# Patient Record
Sex: Male | Born: 1980 | Race: White | Hispanic: No
Health system: Southern US, Community
[De-identification: ages and names within clinical notes are randomized; demographics above are authoritative.]

---

## 2007-11-06 ENCOUNTER — Emergency Department (HOSPITAL_COMMUNITY): Admission: EM | Admit: 2007-11-06 | Discharge: 2007-11-06 | Payer: Self-pay | Admitting: Emergency Medicine

## 2015-08-09 ENCOUNTER — Emergency Department (HOSPITAL_COMMUNITY): Payer: No Typology Code available for payment source

## 2015-08-09 ENCOUNTER — Encounter (HOSPITAL_COMMUNITY): Payer: Self-pay | Admitting: Emergency Medicine

## 2015-08-09 ENCOUNTER — Emergency Department (HOSPITAL_COMMUNITY)
Admission: EM | Admit: 2015-08-09 | Discharge: 2015-08-09 | Disposition: A | Discharge status: Home / Self Care | Payer: No Typology Code available for payment source | Attending: Emergency Medicine | Admitting: Emergency Medicine

## 2015-08-09 DIAGNOSIS — Y998 Other external cause status: Secondary | ICD-10-CM | POA: Diagnosis not present

## 2015-08-09 DIAGNOSIS — F10929 Alcohol use, unspecified with intoxication, unspecified: Secondary | ICD-10-CM

## 2015-08-09 DIAGNOSIS — Y92410 Unspecified street and highway as the place of occurrence of the external cause: Secondary | ICD-10-CM | POA: Insufficient documentation

## 2015-08-09 DIAGNOSIS — S0990XA Unspecified injury of head, initial encounter: Secondary | ICD-10-CM | POA: Insufficient documentation

## 2015-08-09 DIAGNOSIS — S70212A Abrasion, left hip, initial encounter: Secondary | ICD-10-CM | POA: Diagnosis not present

## 2015-08-09 DIAGNOSIS — Y9389 Activity, other specified: Secondary | ICD-10-CM | POA: Diagnosis not present

## 2015-08-09 DIAGNOSIS — R000 Tachycardia, unspecified: Secondary | ICD-10-CM | POA: Insufficient documentation

## 2015-08-09 DIAGNOSIS — F10129 Alcohol abuse with intoxication, unspecified: Secondary | ICD-10-CM | POA: Insufficient documentation

## 2015-08-09 LAB — COMPREHENSIVE METABOLIC PANEL
ALT: 51 U/L (ref 17–63)
AST: 66 U/L — AB (ref 15–41)
Albumin: 4.3 g/dL (ref 3.5–5.0)
Alkaline Phosphatase: 68 U/L (ref 38–126)
Anion gap: 10 (ref 5–15)
CALCIUM: 9 mg/dL (ref 8.9–10.3)
CHLORIDE: 109 mmol/L (ref 101–111)
CO2: 25 mmol/L (ref 22–32)
CREATININE: 0.95 mg/dL (ref 0.61–1.24)
Glucose, Bld: 94 mg/dL (ref 65–99)
POTASSIUM: 4.3 mmol/L (ref 3.5–5.1)
Sodium: 144 mmol/L (ref 135–145)
TOTAL PROTEIN: 7.5 g/dL (ref 6.5–8.1)
Total Bilirubin: 0.3 mg/dL (ref 0.3–1.2)

## 2015-08-09 LAB — CDS SEROLOGY

## 2015-08-09 LAB — URINALYSIS, ROUTINE W REFLEX MICROSCOPIC
Bilirubin Urine: NEGATIVE
GLUCOSE, UA: NEGATIVE mg/dL
Ketones, ur: NEGATIVE mg/dL
LEUKOCYTES UA: NEGATIVE
Nitrite: NEGATIVE
PH: 6.5 (ref 5.0–8.0)
PROTEIN: NEGATIVE mg/dL
SPECIFIC GRAVITY, URINE: 1.026 (ref 1.005–1.030)

## 2015-08-09 LAB — PREPARE FRESH FROZEN PLASMA
UNIT DIVISION: 0
Unit division: 0

## 2015-08-09 LAB — I-STAT CG4 LACTIC ACID, ED: Lactic Acid, Venous: 2.69 mmol/L (ref 0.5–2.0)

## 2015-08-09 LAB — TYPE AND SCREEN
ABO/RH(D): A POS
Antibody Screen: NEGATIVE
Unit division: 0
Unit division: 0

## 2015-08-09 LAB — URINE MICROSCOPIC-ADD ON

## 2015-08-09 LAB — CBC
HCT: 50.1 % (ref 39.0–52.0)
HEMOGLOBIN: 16.7 g/dL (ref 13.0–17.0)
MCH: 30.8 pg (ref 26.0–34.0)
MCHC: 33.3 g/dL (ref 30.0–36.0)
MCV: 92.4 fL (ref 78.0–100.0)
PLATELETS: 262 10*3/uL (ref 150–400)
RBC: 5.42 MIL/uL (ref 4.22–5.81)
RDW: 14.2 % (ref 11.5–15.5)
WBC: 13.1 10*3/uL — ABNORMAL HIGH (ref 4.0–10.5)

## 2015-08-09 LAB — RAPID URINE DRUG SCREEN, HOSP PERFORMED
AMPHETAMINES: NOT DETECTED
BENZODIAZEPINES: POSITIVE — AB
Barbiturates: NOT DETECTED
Cocaine: POSITIVE — AB
Opiates: NOT DETECTED
TETRAHYDROCANNABINOL: POSITIVE — AB

## 2015-08-09 LAB — PROTIME-INR
INR: 0.98 (ref 0.00–1.49)
PROTHROMBIN TIME: 13.2 s (ref 11.6–15.2)

## 2015-08-09 LAB — ABO/RH: ABO/RH(D): A POS

## 2015-08-09 LAB — ETHANOL: ALCOHOL ETHYL (B): 238 mg/dL — AB (ref <5)

## 2015-08-09 MED ORDER — TETANUS-DIPHTH-ACELL PERTUSSIS 5-2.5-18.5 LF-MCG/0.5 IM SUSP
0.5000 mL | Freq: Once | INTRAMUSCULAR | Status: AC
  Administered 2015-08-09: 0.5 mL via INTRAMUSCULAR

## 2015-08-09 MED ORDER — SODIUM CHLORIDE 0.9 % IV BOLUS (SEPSIS)
1000.0000 mL | Freq: Once | INTRAVENOUS | Status: AC
  Administered 2015-08-09: 1000 mL via INTRAVENOUS

## 2015-08-09 MED ORDER — TETANUS-DIPHTH-ACELL PERTUSSIS 5-2.5-18.5 LF-MCG/0.5 IM SUSP
INTRAMUSCULAR | Status: AC
  Filled 2015-08-09: qty 0.5

## 2015-08-09 MED ORDER — IBUPROFEN 800 MG PO TABS
800.0000 mg | ORAL_TABLET | Freq: Once | ORAL | Status: AC
  Administered 2015-08-09: 800 mg via ORAL
  Filled 2015-08-09: qty 1

## 2015-08-09 MED ORDER — IOHEXOL 300 MG/ML  SOLN
100.0000 mL | Freq: Once | INTRAMUSCULAR | Status: AC | PRN
  Administered 2015-08-09: 100 mL via INTRAVENOUS
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
## 2015-08-09 NOTE — ED Notes (Signed)
MD made aware that pt has swelling and abrasion to left jaw area with pain and tenderness. CT maxillofacial ordered. Pt made aware and states that he will wait for scan.
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
## 2015-08-09 NOTE — ED Notes (Signed)
Returned from CT.
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
